# Patient Record
Sex: Male | Born: 1935 | Race: White | Hispanic: No | Marital: Single | Smoking: Former smoker
Health system: Southern US, Community
[De-identification: ages and names within clinical notes are randomized; demographics above are authoritative.]

## PROBLEM LIST (undated history)

## (undated) HISTORY — PX: PACEMAKER PLACEMENT: SHX43

---

## 2005-07-09 ENCOUNTER — Ambulatory Visit: Payer: Self-pay

## 2009-05-11 ENCOUNTER — Ambulatory Visit: Payer: Self-pay | Admitting: Ophthalmology

## 2009-05-24 ENCOUNTER — Ambulatory Visit: Payer: Self-pay | Admitting: Ophthalmology

## 2010-06-30 ENCOUNTER — Emergency Department: Payer: Self-pay | Admitting: Emergency Medicine

## 2011-05-21 ENCOUNTER — Ambulatory Visit: Payer: Self-pay | Admitting: Ophthalmology

## 2011-06-07 ENCOUNTER — Ambulatory Visit: Payer: Self-pay

## 2011-06-08 LAB — PSA: PSA: 4 ng/mL (ref 0.0–4.0)

## 2014-09-09 DIAGNOSIS — I25810 Atherosclerosis of coronary artery bypass graft(s) without angina pectoris: Secondary | ICD-10-CM | POA: Diagnosis not present

## 2014-09-09 DIAGNOSIS — Z0000 Encounter for general adult medical examination without abnormal findings: Secondary | ICD-10-CM | POA: Diagnosis not present

## 2014-09-09 DIAGNOSIS — G588 Other specified mononeuropathies: Secondary | ICD-10-CM | POA: Diagnosis not present

## 2014-09-10 DIAGNOSIS — I25810 Atherosclerosis of coronary artery bypass graft(s) without angina pectoris: Secondary | ICD-10-CM | POA: Diagnosis not present

## 2014-10-27 DIAGNOSIS — F1190 Opioid use, unspecified, uncomplicated: Secondary | ICD-10-CM | POA: Diagnosis not present

## 2014-10-27 DIAGNOSIS — Z5181 Encounter for therapeutic drug level monitoring: Secondary | ICD-10-CM | POA: Diagnosis not present

## 2014-10-27 DIAGNOSIS — G588 Other specified mononeuropathies: Secondary | ICD-10-CM | POA: Diagnosis not present

## 2014-10-27 DIAGNOSIS — Z79891 Long term (current) use of opiate analgesic: Secondary | ICD-10-CM | POA: Diagnosis not present

## 2015-01-18 DIAGNOSIS — E785 Hyperlipidemia, unspecified: Secondary | ICD-10-CM | POA: Diagnosis not present

## 2015-01-18 DIAGNOSIS — Z79899 Other long term (current) drug therapy: Secondary | ICD-10-CM | POA: Diagnosis not present

## 2015-01-20 DIAGNOSIS — Z79899 Other long term (current) drug therapy: Secondary | ICD-10-CM | POA: Diagnosis not present

## 2015-01-20 DIAGNOSIS — M5416 Radiculopathy, lumbar region: Secondary | ICD-10-CM | POA: Diagnosis not present

## 2015-01-20 DIAGNOSIS — M79606 Pain in leg, unspecified: Secondary | ICD-10-CM | POA: Diagnosis not present

## 2015-01-20 DIAGNOSIS — F1120 Opioid dependence, uncomplicated: Secondary | ICD-10-CM | POA: Diagnosis not present

## 2015-01-20 DIAGNOSIS — M5410 Radiculopathy, site unspecified: Secondary | ICD-10-CM | POA: Diagnosis not present

## 2015-01-20 DIAGNOSIS — Z79891 Long term (current) use of opiate analgesic: Secondary | ICD-10-CM | POA: Diagnosis not present

## 2015-01-20 DIAGNOSIS — G8929 Other chronic pain: Secondary | ICD-10-CM | POA: Diagnosis not present

## 2015-01-20 DIAGNOSIS — Z5181 Encounter for therapeutic drug level monitoring: Secondary | ICD-10-CM | POA: Diagnosis not present

## 2015-01-20 DIAGNOSIS — F1190 Opioid use, unspecified, uncomplicated: Secondary | ICD-10-CM | POA: Diagnosis not present

## 2015-01-26 DIAGNOSIS — F1190 Opioid use, unspecified, uncomplicated: Secondary | ICD-10-CM | POA: Diagnosis not present

## 2015-01-26 DIAGNOSIS — Z5181 Encounter for therapeutic drug level monitoring: Secondary | ICD-10-CM | POA: Diagnosis not present

## 2015-01-26 DIAGNOSIS — Z79891 Long term (current) use of opiate analgesic: Secondary | ICD-10-CM | POA: Diagnosis not present

## 2015-01-26 DIAGNOSIS — G603 Idiopathic progressive neuropathy: Secondary | ICD-10-CM | POA: Diagnosis not present

## 2015-02-04 ENCOUNTER — Telehealth: Payer: Self-pay | Admitting: Family Medicine

## 2015-02-09 ENCOUNTER — Telehealth: Payer: Self-pay | Admitting: Anesthesiology

## 2015-02-09 NOTE — Telephone Encounter (Signed)
Error

## 2015-02-09 NOTE — Telephone Encounter (Signed)
Left vm to call to sch apt with parris , pt papers in parris vm folder

## 2015-02-22 DIAGNOSIS — F4542 Pain disorder with related psychological factors: Secondary | ICD-10-CM | POA: Diagnosis not present

## 2015-02-23 DIAGNOSIS — I213 ST elevation (STEMI) myocardial infarction of unspecified site: Secondary | ICD-10-CM | POA: Diagnosis not present

## 2015-02-23 DIAGNOSIS — I509 Heart failure, unspecified: Secondary | ICD-10-CM | POA: Diagnosis not present

## 2015-02-23 DIAGNOSIS — S0990XA Unspecified injury of head, initial encounter: Secondary | ICD-10-CM | POA: Diagnosis not present

## 2015-02-23 DIAGNOSIS — M79606 Pain in leg, unspecified: Secondary | ICD-10-CM | POA: Diagnosis not present

## 2015-02-23 DIAGNOSIS — F329 Major depressive disorder, single episode, unspecified: Secondary | ICD-10-CM | POA: Diagnosis not present

## 2015-03-10 DIAGNOSIS — E782 Mixed hyperlipidemia: Secondary | ICD-10-CM | POA: Diagnosis not present

## 2015-06-07 DIAGNOSIS — F1190 Opioid use, unspecified, uncomplicated: Secondary | ICD-10-CM | POA: Diagnosis not present

## 2015-06-07 DIAGNOSIS — G603 Idiopathic progressive neuropathy: Secondary | ICD-10-CM | POA: Diagnosis not present

## 2015-07-23 ENCOUNTER — Emergency Department
Admission: EM | Admit: 2015-07-23 | Discharge: 2015-07-23 | Disposition: A | Discharge status: Home / Self Care | Payer: No Typology Code available for payment source | Attending: Student | Admitting: Student

## 2015-07-23 ENCOUNTER — Emergency Department: Payer: No Typology Code available for payment source

## 2015-07-23 ENCOUNTER — Encounter: Payer: Self-pay | Admitting: Emergency Medicine

## 2015-07-23 DIAGNOSIS — Y998 Other external cause status: Secondary | ICD-10-CM | POA: Insufficient documentation

## 2015-07-23 DIAGNOSIS — S39012A Strain of muscle, fascia and tendon of lower back, initial encounter: Secondary | ICD-10-CM | POA: Diagnosis not present

## 2015-07-23 DIAGNOSIS — S299XXA Unspecified injury of thorax, initial encounter: Secondary | ICD-10-CM | POA: Diagnosis not present

## 2015-07-23 DIAGNOSIS — S3992XA Unspecified injury of lower back, initial encounter: Secondary | ICD-10-CM | POA: Diagnosis present

## 2015-07-23 DIAGNOSIS — Y92410 Unspecified street and highway as the place of occurrence of the external cause: Secondary | ICD-10-CM | POA: Insufficient documentation

## 2015-07-23 DIAGNOSIS — Z87891 Personal history of nicotine dependence: Secondary | ICD-10-CM | POA: Diagnosis not present

## 2015-07-23 DIAGNOSIS — R079 Chest pain, unspecified: Secondary | ICD-10-CM | POA: Diagnosis not present

## 2015-07-23 DIAGNOSIS — R0789 Other chest pain: Secondary | ICD-10-CM | POA: Diagnosis not present

## 2015-07-23 DIAGNOSIS — Y9389 Activity, other specified: Secondary | ICD-10-CM | POA: Insufficient documentation
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
## 2015-07-23 NOTE — ED Notes (Signed)
[facility] declines seeing patient, patient kept in main waiting room.
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
## 2015-11-29 DIAGNOSIS — Z79891 Long term (current) use of opiate analgesic: Secondary | ICD-10-CM | POA: Diagnosis not present

## 2015-11-29 DIAGNOSIS — G609 Hereditary and idiopathic neuropathy, unspecified: Secondary | ICD-10-CM | POA: Diagnosis not present

## 2015-11-29 DIAGNOSIS — Z5181 Encounter for therapeutic drug level monitoring: Secondary | ICD-10-CM | POA: Diagnosis not present

## 2015-11-29 DIAGNOSIS — F1190 Opioid use, unspecified, uncomplicated: Secondary | ICD-10-CM | POA: Diagnosis not present

## 2016-02-28 DIAGNOSIS — Z5181 Encounter for therapeutic drug level monitoring: Secondary | ICD-10-CM | POA: Diagnosis not present

## 2016-02-28 DIAGNOSIS — Z79891 Long term (current) use of opiate analgesic: Secondary | ICD-10-CM | POA: Diagnosis not present

## 2016-02-28 DIAGNOSIS — G609 Hereditary and idiopathic neuropathy, unspecified: Secondary | ICD-10-CM | POA: Diagnosis not present

## 2016-05-25 DIAGNOSIS — F1190 Opioid use, unspecified, uncomplicated: Secondary | ICD-10-CM | POA: Diagnosis not present

## 2016-05-25 DIAGNOSIS — Z5181 Encounter for therapeutic drug level monitoring: Secondary | ICD-10-CM | POA: Diagnosis not present

## 2016-05-25 DIAGNOSIS — G6189 Other inflammatory polyneuropathies: Secondary | ICD-10-CM | POA: Diagnosis not present

## 2016-05-25 DIAGNOSIS — Z79891 Long term (current) use of opiate analgesic: Secondary | ICD-10-CM | POA: Diagnosis not present
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
## 2016-08-24 DIAGNOSIS — Z79891 Long term (current) use of opiate analgesic: Secondary | ICD-10-CM | POA: Diagnosis not present

## 2016-08-24 DIAGNOSIS — F1190 Opioid use, unspecified, uncomplicated: Secondary | ICD-10-CM | POA: Diagnosis not present

## 2016-08-24 DIAGNOSIS — G609 Hereditary and idiopathic neuropathy, unspecified: Secondary | ICD-10-CM | POA: Diagnosis not present

## 2016-08-24 DIAGNOSIS — Z5181 Encounter for therapeutic drug level monitoring: Secondary | ICD-10-CM | POA: Diagnosis not present

## 2016-11-23 DIAGNOSIS — F1190 Opioid use, unspecified, uncomplicated: Secondary | ICD-10-CM | POA: Diagnosis not present

## 2016-11-23 DIAGNOSIS — G6189 Other inflammatory polyneuropathies: Secondary | ICD-10-CM | POA: Diagnosis not present

## 2017-03-01 DIAGNOSIS — Z79899 Other long term (current) drug therapy: Secondary | ICD-10-CM | POA: Diagnosis not present

## 2017-03-01 DIAGNOSIS — Z87891 Personal history of nicotine dependence: Secondary | ICD-10-CM | POA: Diagnosis not present

## 2017-03-01 DIAGNOSIS — F1190 Opioid use, unspecified, uncomplicated: Secondary | ICD-10-CM | POA: Diagnosis not present

## 2017-03-01 DIAGNOSIS — G6189 Other inflammatory polyneuropathies: Secondary | ICD-10-CM | POA: Diagnosis not present

## 2017-05-21 DIAGNOSIS — F1190 Opioid use, unspecified, uncomplicated: Secondary | ICD-10-CM | POA: Diagnosis not present

## 2017-05-21 DIAGNOSIS — G6189 Other inflammatory polyneuropathies: Secondary | ICD-10-CM | POA: Diagnosis not present

## 2017-05-28 DIAGNOSIS — Z0000 Encounter for general adult medical examination without abnormal findings: Secondary | ICD-10-CM | POA: Diagnosis not present

## 2017-08-20 DIAGNOSIS — G6189 Other inflammatory polyneuropathies: Secondary | ICD-10-CM | POA: Diagnosis not present

## 2017-08-20 DIAGNOSIS — Z5181 Encounter for therapeutic drug level monitoring: Secondary | ICD-10-CM | POA: Diagnosis not present

## 2017-08-20 DIAGNOSIS — Z79891 Long term (current) use of opiate analgesic: Secondary | ICD-10-CM | POA: Diagnosis not present

## 2017-11-08 DIAGNOSIS — G629 Polyneuropathy, unspecified: Secondary | ICD-10-CM | POA: Diagnosis not present

## 2017-11-08 DIAGNOSIS — Z79891 Long term (current) use of opiate analgesic: Secondary | ICD-10-CM | POA: Diagnosis not present

## 2017-11-08 DIAGNOSIS — G709 Myoneural disorder, unspecified: Secondary | ICD-10-CM | POA: Diagnosis not present

## 2017-11-08 DIAGNOSIS — Z5181 Encounter for therapeutic drug level monitoring: Secondary | ICD-10-CM | POA: Diagnosis not present

## 2017-11-15 DIAGNOSIS — I492 Junctional premature depolarization: Secondary | ICD-10-CM | POA: Diagnosis not present

## 2017-11-15 DIAGNOSIS — Z45018 Encounter for adjustment and management of other part of cardiac pacemaker: Secondary | ICD-10-CM | POA: Diagnosis not present

## 2017-11-15 DIAGNOSIS — I495 Sick sinus syndrome: Secondary | ICD-10-CM | POA: Diagnosis not present

## 2017-11-15 DIAGNOSIS — I5032 Chronic diastolic (congestive) heart failure: Secondary | ICD-10-CM | POA: Diagnosis not present

## 2018-02-07 DIAGNOSIS — G629 Polyneuropathy, unspecified: Secondary | ICD-10-CM | POA: Diagnosis not present

## 2018-02-07 DIAGNOSIS — Z79891 Long term (current) use of opiate analgesic: Secondary | ICD-10-CM | POA: Diagnosis not present

## 2018-02-07 DIAGNOSIS — Z5181 Encounter for therapeutic drug level monitoring: Secondary | ICD-10-CM | POA: Diagnosis not present

## 2018-05-08 DIAGNOSIS — Z5181 Encounter for therapeutic drug level monitoring: Secondary | ICD-10-CM | POA: Diagnosis not present

## 2018-05-08 DIAGNOSIS — G629 Polyneuropathy, unspecified: Secondary | ICD-10-CM | POA: Diagnosis not present

## 2018-05-08 DIAGNOSIS — Z79891 Long term (current) use of opiate analgesic: Secondary | ICD-10-CM | POA: Diagnosis not present

## 2018-08-05 DIAGNOSIS — G629 Polyneuropathy, unspecified: Secondary | ICD-10-CM | POA: Diagnosis not present

## 2018-08-05 DIAGNOSIS — Z79891 Long term (current) use of opiate analgesic: Secondary | ICD-10-CM | POA: Diagnosis not present

## 2018-08-05 DIAGNOSIS — F1190 Opioid use, unspecified, uncomplicated: Secondary | ICD-10-CM | POA: Diagnosis not present

## 2018-08-05 DIAGNOSIS — Z5181 Encounter for therapeutic drug level monitoring: Secondary | ICD-10-CM | POA: Diagnosis not present

## 2018-10-22 DIAGNOSIS — C44319 Basal cell carcinoma of skin of other parts of face: Secondary | ICD-10-CM | POA: Diagnosis not present

## 2018-10-22 DIAGNOSIS — L578 Other skin changes due to chronic exposure to nonionizing radiation: Secondary | ICD-10-CM | POA: Diagnosis not present

## 2018-10-22 DIAGNOSIS — D485 Neoplasm of uncertain behavior of skin: Secondary | ICD-10-CM | POA: Diagnosis not present

## 2018-10-28 DIAGNOSIS — C44319 Basal cell carcinoma of skin of other parts of face: Secondary | ICD-10-CM | POA: Diagnosis not present

## 2018-10-28 DIAGNOSIS — L988 Other specified disorders of the skin and subcutaneous tissue: Secondary | ICD-10-CM | POA: Diagnosis not present

## 2018-10-28 DIAGNOSIS — C4491 Basal cell carcinoma of skin, unspecified: Secondary | ICD-10-CM

## 2018-10-28 HISTORY — DX: Basal cell carcinoma of skin, unspecified: C44.91

## 2018-10-29 DIAGNOSIS — C44319 Basal cell carcinoma of skin of other parts of face: Secondary | ICD-10-CM | POA: Diagnosis not present

## 2018-11-04 DIAGNOSIS — G629 Polyneuropathy, unspecified: Secondary | ICD-10-CM | POA: Diagnosis not present

## 2018-11-04 DIAGNOSIS — Z4802 Encounter for removal of sutures: Secondary | ICD-10-CM | POA: Diagnosis not present

## 2018-12-09 DIAGNOSIS — H26491 Other secondary cataract, right eye: Secondary | ICD-10-CM | POA: Diagnosis not present

## 2018-12-09 DIAGNOSIS — H35341 Macular cyst, hole, or pseudohole, right eye: Secondary | ICD-10-CM | POA: Diagnosis not present

## 2018-12-09 DIAGNOSIS — H35372 Puckering of macula, left eye: Secondary | ICD-10-CM | POA: Diagnosis not present

## 2018-12-09 DIAGNOSIS — H02886 Meibomian gland dysfunction of left eye, unspecified eyelid: Secondary | ICD-10-CM | POA: Diagnosis not present

## 2018-12-09 DIAGNOSIS — H02883 Meibomian gland dysfunction of right eye, unspecified eyelid: Secondary | ICD-10-CM | POA: Diagnosis not present

## 2019-02-05 DIAGNOSIS — M79605 Pain in left leg: Secondary | ICD-10-CM | POA: Diagnosis not present

## 2019-02-05 DIAGNOSIS — Z5181 Encounter for therapeutic drug level monitoring: Secondary | ICD-10-CM | POA: Diagnosis not present

## 2019-02-05 DIAGNOSIS — M792 Neuralgia and neuritis, unspecified: Secondary | ICD-10-CM | POA: Diagnosis not present

## 2019-02-05 DIAGNOSIS — Z79891 Long term (current) use of opiate analgesic: Secondary | ICD-10-CM | POA: Diagnosis not present

## 2019-02-05 DIAGNOSIS — F1190 Opioid use, unspecified, uncomplicated: Secondary | ICD-10-CM | POA: Diagnosis not present

## 2019-02-05 DIAGNOSIS — G629 Polyneuropathy, unspecified: Secondary | ICD-10-CM | POA: Diagnosis not present

## 2019-02-05 DIAGNOSIS — G8929 Other chronic pain: Secondary | ICD-10-CM | POA: Diagnosis not present

## 2019-04-07 DIAGNOSIS — Z5181 Encounter for therapeutic drug level monitoring: Secondary | ICD-10-CM | POA: Diagnosis not present

## 2019-04-07 DIAGNOSIS — Z79891 Long term (current) use of opiate analgesic: Secondary | ICD-10-CM | POA: Diagnosis not present

## 2019-04-07 DIAGNOSIS — G629 Polyneuropathy, unspecified: Secondary | ICD-10-CM | POA: Diagnosis not present

## 2019-06-25 DIAGNOSIS — M9905 Segmental and somatic dysfunction of pelvic region: Secondary | ICD-10-CM | POA: Diagnosis not present

## 2019-06-25 DIAGNOSIS — M9903 Segmental and somatic dysfunction of lumbar region: Secondary | ICD-10-CM | POA: Diagnosis not present

## 2019-06-25 DIAGNOSIS — M5416 Radiculopathy, lumbar region: Secondary | ICD-10-CM | POA: Diagnosis not present

## 2019-06-25 DIAGNOSIS — M5136 Other intervertebral disc degeneration, lumbar region: Secondary | ICD-10-CM | POA: Diagnosis not present

## 2019-06-26 DIAGNOSIS — M9905 Segmental and somatic dysfunction of pelvic region: Secondary | ICD-10-CM | POA: Diagnosis not present

## 2019-06-26 DIAGNOSIS — M5136 Other intervertebral disc degeneration, lumbar region: Secondary | ICD-10-CM | POA: Diagnosis not present

## 2019-06-26 DIAGNOSIS — M9903 Segmental and somatic dysfunction of lumbar region: Secondary | ICD-10-CM | POA: Diagnosis not present

## 2019-06-26 DIAGNOSIS — M5416 Radiculopathy, lumbar region: Secondary | ICD-10-CM | POA: Diagnosis not present

## 2019-06-29 DIAGNOSIS — M5416 Radiculopathy, lumbar region: Secondary | ICD-10-CM | POA: Diagnosis not present

## 2019-06-29 DIAGNOSIS — M5136 Other intervertebral disc degeneration, lumbar region: Secondary | ICD-10-CM | POA: Diagnosis not present

## 2019-06-29 DIAGNOSIS — M9905 Segmental and somatic dysfunction of pelvic region: Secondary | ICD-10-CM | POA: Diagnosis not present

## 2019-06-29 DIAGNOSIS — M9903 Segmental and somatic dysfunction of lumbar region: Secondary | ICD-10-CM | POA: Diagnosis not present

## 2019-06-30 DIAGNOSIS — M9905 Segmental and somatic dysfunction of pelvic region: Secondary | ICD-10-CM | POA: Diagnosis not present

## 2019-06-30 DIAGNOSIS — M5136 Other intervertebral disc degeneration, lumbar region: Secondary | ICD-10-CM | POA: Diagnosis not present

## 2019-06-30 DIAGNOSIS — M9903 Segmental and somatic dysfunction of lumbar region: Secondary | ICD-10-CM | POA: Diagnosis not present

## 2019-06-30 DIAGNOSIS — M5416 Radiculopathy, lumbar region: Secondary | ICD-10-CM | POA: Diagnosis not present

## 2019-07-01 DIAGNOSIS — M9903 Segmental and somatic dysfunction of lumbar region: Secondary | ICD-10-CM | POA: Diagnosis not present

## 2019-07-01 DIAGNOSIS — M5136 Other intervertebral disc degeneration, lumbar region: Secondary | ICD-10-CM | POA: Diagnosis not present

## 2019-07-01 DIAGNOSIS — M9905 Segmental and somatic dysfunction of pelvic region: Secondary | ICD-10-CM | POA: Diagnosis not present

## 2019-07-01 DIAGNOSIS — M5416 Radiculopathy, lumbar region: Secondary | ICD-10-CM | POA: Diagnosis not present

## 2019-07-06 DIAGNOSIS — M5416 Radiculopathy, lumbar region: Secondary | ICD-10-CM | POA: Diagnosis not present

## 2019-07-06 DIAGNOSIS — M5136 Other intervertebral disc degeneration, lumbar region: Secondary | ICD-10-CM | POA: Diagnosis not present

## 2019-07-06 DIAGNOSIS — M9903 Segmental and somatic dysfunction of lumbar region: Secondary | ICD-10-CM | POA: Diagnosis not present

## 2019-07-06 DIAGNOSIS — M9905 Segmental and somatic dysfunction of pelvic region: Secondary | ICD-10-CM | POA: Diagnosis not present

## 2019-07-08 DIAGNOSIS — M9903 Segmental and somatic dysfunction of lumbar region: Secondary | ICD-10-CM | POA: Diagnosis not present

## 2019-07-08 DIAGNOSIS — M5136 Other intervertebral disc degeneration, lumbar region: Secondary | ICD-10-CM | POA: Diagnosis not present

## 2019-07-08 DIAGNOSIS — M5416 Radiculopathy, lumbar region: Secondary | ICD-10-CM | POA: Diagnosis not present

## 2019-07-08 DIAGNOSIS — M9905 Segmental and somatic dysfunction of pelvic region: Secondary | ICD-10-CM | POA: Diagnosis not present

## 2019-07-09 DIAGNOSIS — M9905 Segmental and somatic dysfunction of pelvic region: Secondary | ICD-10-CM | POA: Diagnosis not present

## 2019-07-09 DIAGNOSIS — M5136 Other intervertebral disc degeneration, lumbar region: Secondary | ICD-10-CM | POA: Diagnosis not present

## 2019-07-09 DIAGNOSIS — M9903 Segmental and somatic dysfunction of lumbar region: Secondary | ICD-10-CM | POA: Diagnosis not present

## 2019-07-09 DIAGNOSIS — M5416 Radiculopathy, lumbar region: Secondary | ICD-10-CM | POA: Diagnosis not present

## 2019-07-13 DIAGNOSIS — M9905 Segmental and somatic dysfunction of pelvic region: Secondary | ICD-10-CM | POA: Diagnosis not present

## 2019-07-13 DIAGNOSIS — M9903 Segmental and somatic dysfunction of lumbar region: Secondary | ICD-10-CM | POA: Diagnosis not present

## 2019-07-13 DIAGNOSIS — M5416 Radiculopathy, lumbar region: Secondary | ICD-10-CM | POA: Diagnosis not present

## 2019-07-13 DIAGNOSIS — M5136 Other intervertebral disc degeneration, lumbar region: Secondary | ICD-10-CM | POA: Diagnosis not present

## 2019-07-15 DIAGNOSIS — M9905 Segmental and somatic dysfunction of pelvic region: Secondary | ICD-10-CM | POA: Diagnosis not present

## 2019-07-15 DIAGNOSIS — M5416 Radiculopathy, lumbar region: Secondary | ICD-10-CM | POA: Diagnosis not present

## 2019-07-15 DIAGNOSIS — M5136 Other intervertebral disc degeneration, lumbar region: Secondary | ICD-10-CM | POA: Diagnosis not present

## 2019-07-15 DIAGNOSIS — M9903 Segmental and somatic dysfunction of lumbar region: Secondary | ICD-10-CM | POA: Diagnosis not present

## 2019-07-16 DIAGNOSIS — M9905 Segmental and somatic dysfunction of pelvic region: Secondary | ICD-10-CM | POA: Diagnosis not present

## 2019-07-16 DIAGNOSIS — M5136 Other intervertebral disc degeneration, lumbar region: Secondary | ICD-10-CM | POA: Diagnosis not present

## 2019-07-16 DIAGNOSIS — M5416 Radiculopathy, lumbar region: Secondary | ICD-10-CM | POA: Diagnosis not present

## 2019-07-16 DIAGNOSIS — M9903 Segmental and somatic dysfunction of lumbar region: Secondary | ICD-10-CM | POA: Diagnosis not present

## 2019-07-21 DIAGNOSIS — M9903 Segmental and somatic dysfunction of lumbar region: Secondary | ICD-10-CM | POA: Diagnosis not present

## 2019-07-21 DIAGNOSIS — M9905 Segmental and somatic dysfunction of pelvic region: Secondary | ICD-10-CM | POA: Diagnosis not present

## 2019-07-21 DIAGNOSIS — M5136 Other intervertebral disc degeneration, lumbar region: Secondary | ICD-10-CM | POA: Diagnosis not present

## 2019-07-21 DIAGNOSIS — M5416 Radiculopathy, lumbar region: Secondary | ICD-10-CM | POA: Diagnosis not present

## 2019-07-23 DIAGNOSIS — M5416 Radiculopathy, lumbar region: Secondary | ICD-10-CM | POA: Diagnosis not present

## 2019-07-23 DIAGNOSIS — M5136 Other intervertebral disc degeneration, lumbar region: Secondary | ICD-10-CM | POA: Diagnosis not present

## 2019-07-23 DIAGNOSIS — M9903 Segmental and somatic dysfunction of lumbar region: Secondary | ICD-10-CM | POA: Diagnosis not present

## 2019-07-23 DIAGNOSIS — M9905 Segmental and somatic dysfunction of pelvic region: Secondary | ICD-10-CM | POA: Diagnosis not present

## 2019-07-24 DIAGNOSIS — G709 Myoneural disorder, unspecified: Secondary | ICD-10-CM | POA: Diagnosis not present

## 2019-07-24 DIAGNOSIS — Z5181 Encounter for therapeutic drug level monitoring: Secondary | ICD-10-CM | POA: Diagnosis not present

## 2019-07-24 DIAGNOSIS — Z79891 Long term (current) use of opiate analgesic: Secondary | ICD-10-CM | POA: Diagnosis not present

## 2019-07-24 DIAGNOSIS — G8929 Other chronic pain: Secondary | ICD-10-CM | POA: Diagnosis not present

## 2019-07-24 DIAGNOSIS — G629 Polyneuropathy, unspecified: Secondary | ICD-10-CM | POA: Diagnosis not present

## 2019-07-24 DIAGNOSIS — M792 Neuralgia and neuritis, unspecified: Secondary | ICD-10-CM | POA: Diagnosis not present

## 2019-07-28 DIAGNOSIS — M9903 Segmental and somatic dysfunction of lumbar region: Secondary | ICD-10-CM | POA: Diagnosis not present

## 2019-07-28 DIAGNOSIS — M9905 Segmental and somatic dysfunction of pelvic region: Secondary | ICD-10-CM | POA: Diagnosis not present

## 2019-07-28 DIAGNOSIS — M5136 Other intervertebral disc degeneration, lumbar region: Secondary | ICD-10-CM | POA: Diagnosis not present

## 2019-07-28 DIAGNOSIS — M5416 Radiculopathy, lumbar region: Secondary | ICD-10-CM | POA: Diagnosis not present

## 2019-07-30 DIAGNOSIS — M9905 Segmental and somatic dysfunction of pelvic region: Secondary | ICD-10-CM | POA: Diagnosis not present

## 2019-07-30 DIAGNOSIS — M5136 Other intervertebral disc degeneration, lumbar region: Secondary | ICD-10-CM | POA: Diagnosis not present

## 2019-07-30 DIAGNOSIS — M9903 Segmental and somatic dysfunction of lumbar region: Secondary | ICD-10-CM | POA: Diagnosis not present

## 2019-07-30 DIAGNOSIS — M5416 Radiculopathy, lumbar region: Secondary | ICD-10-CM | POA: Diagnosis not present

## 2019-08-05 DIAGNOSIS — M5136 Other intervertebral disc degeneration, lumbar region: Secondary | ICD-10-CM | POA: Diagnosis not present

## 2019-08-05 DIAGNOSIS — M9903 Segmental and somatic dysfunction of lumbar region: Secondary | ICD-10-CM | POA: Diagnosis not present

## 2019-08-05 DIAGNOSIS — M9905 Segmental and somatic dysfunction of pelvic region: Secondary | ICD-10-CM | POA: Diagnosis not present

## 2019-08-05 DIAGNOSIS — M5416 Radiculopathy, lumbar region: Secondary | ICD-10-CM | POA: Diagnosis not present

## 2019-08-12 DIAGNOSIS — M5136 Other intervertebral disc degeneration, lumbar region: Secondary | ICD-10-CM | POA: Diagnosis not present

## 2019-08-12 DIAGNOSIS — M9903 Segmental and somatic dysfunction of lumbar region: Secondary | ICD-10-CM | POA: Diagnosis not present

## 2019-08-12 DIAGNOSIS — M9905 Segmental and somatic dysfunction of pelvic region: Secondary | ICD-10-CM | POA: Diagnosis not present

## 2019-08-12 DIAGNOSIS — M5416 Radiculopathy, lumbar region: Secondary | ICD-10-CM | POA: Diagnosis not present

## 2019-08-19 DIAGNOSIS — M9903 Segmental and somatic dysfunction of lumbar region: Secondary | ICD-10-CM | POA: Diagnosis not present

## 2019-08-19 DIAGNOSIS — M5416 Radiculopathy, lumbar region: Secondary | ICD-10-CM | POA: Diagnosis not present

## 2019-08-19 DIAGNOSIS — M9905 Segmental and somatic dysfunction of pelvic region: Secondary | ICD-10-CM | POA: Diagnosis not present

## 2019-08-19 DIAGNOSIS — M5136 Other intervertebral disc degeneration, lumbar region: Secondary | ICD-10-CM | POA: Diagnosis not present

## 2019-10-23 DIAGNOSIS — S59901A Unspecified injury of right elbow, initial encounter: Secondary | ICD-10-CM | POA: Diagnosis not present

## 2019-10-23 DIAGNOSIS — G238 Other specified degenerative diseases of basal ganglia: Secondary | ICD-10-CM | POA: Diagnosis not present

## 2019-10-23 DIAGNOSIS — S3993XA Unspecified injury of pelvis, initial encounter: Secondary | ICD-10-CM | POA: Diagnosis not present

## 2019-10-23 DIAGNOSIS — R296 Repeated falls: Secondary | ICD-10-CM | POA: Diagnosis not present

## 2019-10-23 DIAGNOSIS — S0081XA Abrasion of other part of head, initial encounter: Secondary | ICD-10-CM | POA: Diagnosis not present

## 2019-10-23 DIAGNOSIS — R9431 Abnormal electrocardiogram [ECG] [EKG]: Secondary | ICD-10-CM | POA: Diagnosis not present

## 2019-10-23 DIAGNOSIS — E782 Mixed hyperlipidemia: Secondary | ICD-10-CM | POA: Diagnosis not present

## 2019-10-23 DIAGNOSIS — G629 Polyneuropathy, unspecified: Secondary | ICD-10-CM | POA: Diagnosis not present

## 2019-10-23 DIAGNOSIS — G8929 Other chronic pain: Secondary | ICD-10-CM | POA: Diagnosis not present

## 2019-10-23 DIAGNOSIS — I440 Atrioventricular block, first degree: Secondary | ICD-10-CM | POA: Diagnosis not present

## 2019-10-23 DIAGNOSIS — T82857A Stenosis of cardiac prosthetic devices, implants and grafts, initial encounter: Secondary | ICD-10-CM | POA: Diagnosis not present

## 2019-10-23 DIAGNOSIS — I2510 Atherosclerotic heart disease of native coronary artery without angina pectoris: Secondary | ICD-10-CM | POA: Diagnosis not present

## 2019-10-23 DIAGNOSIS — W19XXXA Unspecified fall, initial encounter: Secondary | ICD-10-CM | POA: Diagnosis not present

## 2019-10-23 DIAGNOSIS — I25810 Atherosclerosis of coronary artery bypass graft(s) without angina pectoris: Secondary | ICD-10-CM | POA: Diagnosis not present

## 2019-10-23 DIAGNOSIS — S0990XA Unspecified injury of head, initial encounter: Secondary | ICD-10-CM | POA: Diagnosis not present

## 2019-10-23 DIAGNOSIS — I214 Non-ST elevation (NSTEMI) myocardial infarction: Secondary | ICD-10-CM | POA: Diagnosis not present

## 2019-10-23 DIAGNOSIS — F1290 Cannabis use, unspecified, uncomplicated: Secondary | ICD-10-CM | POA: Diagnosis not present

## 2019-10-23 DIAGNOSIS — F1190 Opioid use, unspecified, uncomplicated: Secondary | ICD-10-CM | POA: Diagnosis not present

## 2019-10-23 DIAGNOSIS — I5021 Acute systolic (congestive) heart failure: Secondary | ICD-10-CM | POA: Diagnosis not present

## 2019-10-23 DIAGNOSIS — Z951 Presence of aortocoronary bypass graft: Secondary | ICD-10-CM | POA: Diagnosis not present

## 2019-10-23 DIAGNOSIS — R410 Disorientation, unspecified: Secondary | ICD-10-CM | POA: Diagnosis not present

## 2019-10-23 DIAGNOSIS — R778 Other specified abnormalities of plasma proteins: Secondary | ICD-10-CM | POA: Diagnosis not present

## 2019-10-23 DIAGNOSIS — N179 Acute kidney failure, unspecified: Secondary | ICD-10-CM | POA: Diagnosis not present

## 2019-10-23 DIAGNOSIS — Z79891 Long term (current) use of opiate analgesic: Secondary | ICD-10-CM | POA: Diagnosis not present

## 2019-10-23 DIAGNOSIS — M792 Neuralgia and neuritis, unspecified: Secondary | ICD-10-CM | POA: Diagnosis not present

## 2019-10-23 DIAGNOSIS — S6992XA Unspecified injury of left wrist, hand and finger(s), initial encounter: Secondary | ICD-10-CM | POA: Diagnosis not present

## 2019-10-23 DIAGNOSIS — S199XXA Unspecified injury of neck, initial encounter: Secondary | ICD-10-CM | POA: Diagnosis not present

## 2019-10-23 DIAGNOSIS — Y929 Unspecified place or not applicable: Secondary | ICD-10-CM | POA: Diagnosis not present

## 2019-10-23 DIAGNOSIS — I495 Sick sinus syndrome: Secondary | ICD-10-CM | POA: Diagnosis not present

## 2019-10-23 DIAGNOSIS — S299XXA Unspecified injury of thorax, initial encounter: Secondary | ICD-10-CM | POA: Diagnosis not present

## 2019-10-23 DIAGNOSIS — R4182 Altered mental status, unspecified: Secondary | ICD-10-CM | POA: Diagnosis not present

## 2019-10-23 DIAGNOSIS — Z5181 Encounter for therapeutic drug level monitoring: Secondary | ICD-10-CM | POA: Diagnosis not present

## 2019-10-23 DIAGNOSIS — Z950 Presence of cardiac pacemaker: Secondary | ICD-10-CM | POA: Diagnosis not present

## 2019-10-25 DIAGNOSIS — S3993XA Unspecified injury of pelvis, initial encounter: Secondary | ICD-10-CM | POA: Diagnosis not present

## 2019-10-25 DIAGNOSIS — W19XXXA Unspecified fall, initial encounter: Secondary | ICD-10-CM | POA: Diagnosis not present

## 2019-10-27 DIAGNOSIS — I25810 Atherosclerosis of coronary artery bypass graft(s) without angina pectoris: Secondary | ICD-10-CM | POA: Diagnosis not present

## 2019-10-27 DIAGNOSIS — I2510 Atherosclerotic heart disease of native coronary artery without angina pectoris: Secondary | ICD-10-CM | POA: Diagnosis not present

## 2019-10-27 DIAGNOSIS — Z951 Presence of aortocoronary bypass graft: Secondary | ICD-10-CM | POA: Diagnosis not present

## 2019-10-27 DIAGNOSIS — I214 Non-ST elevation (NSTEMI) myocardial infarction: Secondary | ICD-10-CM | POA: Diagnosis not present

## 2019-10-27 DIAGNOSIS — T82857A Stenosis of cardiac prosthetic devices, implants and grafts, initial encounter: Secondary | ICD-10-CM | POA: Diagnosis not present

## 2019-10-27 DIAGNOSIS — Z950 Presence of cardiac pacemaker: Secondary | ICD-10-CM | POA: Diagnosis not present

## 2019-11-05 DIAGNOSIS — I25810 Atherosclerosis of coronary artery bypass graft(s) without angina pectoris: Secondary | ICD-10-CM | POA: Diagnosis not present

## 2019-11-05 DIAGNOSIS — E782 Mixed hyperlipidemia: Secondary | ICD-10-CM | POA: Diagnosis not present

## 2019-11-05 DIAGNOSIS — I5021 Acute systolic (congestive) heart failure: Secondary | ICD-10-CM | POA: Diagnosis not present

## 2020-01-18 DIAGNOSIS — G629 Polyneuropathy, unspecified: Secondary | ICD-10-CM | POA: Diagnosis not present

## 2020-01-18 DIAGNOSIS — Z5181 Encounter for therapeutic drug level monitoring: Secondary | ICD-10-CM | POA: Diagnosis not present

## 2020-01-18 DIAGNOSIS — Z79891 Long term (current) use of opiate analgesic: Secondary | ICD-10-CM | POA: Diagnosis not present

## 2020-03-21 DIAGNOSIS — G629 Polyneuropathy, unspecified: Secondary | ICD-10-CM | POA: Diagnosis not present

## 2020-03-21 DIAGNOSIS — Z5181 Encounter for therapeutic drug level monitoring: Secondary | ICD-10-CM | POA: Diagnosis not present

## 2020-03-21 DIAGNOSIS — M792 Neuralgia and neuritis, unspecified: Secondary | ICD-10-CM | POA: Diagnosis not present

## 2020-03-21 DIAGNOSIS — Z79891 Long term (current) use of opiate analgesic: Secondary | ICD-10-CM | POA: Diagnosis not present

## 2020-03-21 DIAGNOSIS — G8929 Other chronic pain: Secondary | ICD-10-CM | POA: Diagnosis not present

## 2020-04-07 DIAGNOSIS — I2510 Atherosclerotic heart disease of native coronary artery without angina pectoris: Secondary | ICD-10-CM | POA: Diagnosis not present

## 2020-04-07 DIAGNOSIS — I509 Heart failure, unspecified: Secondary | ICD-10-CM | POA: Diagnosis not present

## 2020-04-07 DIAGNOSIS — I5021 Acute systolic (congestive) heart failure: Secondary | ICD-10-CM | POA: Diagnosis not present

## 2020-04-07 DIAGNOSIS — R5383 Other fatigue: Secondary | ICD-10-CM | POA: Diagnosis not present

## 2020-04-07 DIAGNOSIS — E785 Hyperlipidemia, unspecified: Secondary | ICD-10-CM | POA: Diagnosis not present

## 2020-04-07 DIAGNOSIS — Z131 Encounter for screening for diabetes mellitus: Secondary | ICD-10-CM | POA: Diagnosis not present

## 2020-04-07 DIAGNOSIS — R630 Anorexia: Secondary | ICD-10-CM | POA: Diagnosis not present

## 2020-04-07 DIAGNOSIS — I252 Old myocardial infarction: Secondary | ICD-10-CM | POA: Diagnosis not present

## 2020-04-07 DIAGNOSIS — R634 Abnormal weight loss: Secondary | ICD-10-CM | POA: Diagnosis not present

## 2020-04-07 DIAGNOSIS — K219 Gastro-esophageal reflux disease without esophagitis: Secondary | ICD-10-CM | POA: Diagnosis not present

## 2020-04-07 DIAGNOSIS — R1310 Dysphagia, unspecified: Secondary | ICD-10-CM | POA: Diagnosis not present

## 2020-04-07 DIAGNOSIS — Z681 Body mass index (BMI) 19 or less, adult: Secondary | ICD-10-CM | POA: Diagnosis not present

## 2020-04-29 DIAGNOSIS — K219 Gastro-esophageal reflux disease without esophagitis: Secondary | ICD-10-CM | POA: Diagnosis not present

## 2020-04-29 DIAGNOSIS — N179 Acute kidney failure, unspecified: Secondary | ICD-10-CM | POA: Diagnosis not present

## 2020-04-29 DIAGNOSIS — G4701 Insomnia due to medical condition: Secondary | ICD-10-CM | POA: Diagnosis not present

## 2020-04-29 DIAGNOSIS — D649 Anemia, unspecified: Secondary | ICD-10-CM | POA: Diagnosis not present

## 2020-05-20 DIAGNOSIS — G4701 Insomnia due to medical condition: Secondary | ICD-10-CM | POA: Diagnosis not present

## 2020-06-22 DIAGNOSIS — M792 Neuralgia and neuritis, unspecified: Secondary | ICD-10-CM | POA: Diagnosis not present

## 2020-06-22 DIAGNOSIS — Z5181 Encounter for therapeutic drug level monitoring: Secondary | ICD-10-CM | POA: Diagnosis not present

## 2020-06-22 DIAGNOSIS — Z79891 Long term (current) use of opiate analgesic: Secondary | ICD-10-CM | POA: Diagnosis not present

## 2020-06-22 DIAGNOSIS — G629 Polyneuropathy, unspecified: Secondary | ICD-10-CM | POA: Diagnosis not present

## 2020-06-22 DIAGNOSIS — G8929 Other chronic pain: Secondary | ICD-10-CM | POA: Diagnosis not present

## 2021-06-26 ENCOUNTER — Ambulatory Visit: Payer: Commercial Managed Care - HMO | Admitting: Dermatology

## 2021-06-26 ENCOUNTER — Ambulatory Visit: Payer: Medicare HMO | Admitting: Dermatology

## 2021-06-26 ENCOUNTER — Other Ambulatory Visit: Payer: Self-pay

## 2021-06-26 ENCOUNTER — Encounter: Payer: Self-pay | Admitting: Dermatology

## 2021-06-26 ENCOUNTER — Other Ambulatory Visit: Payer: Self-pay | Admitting: Dermatology

## 2021-06-26 DIAGNOSIS — C44629 Squamous cell carcinoma of skin of left upper limb, including shoulder: Secondary | ICD-10-CM | POA: Diagnosis not present

## 2021-06-26 DIAGNOSIS — D485 Neoplasm of uncertain behavior of skin: Secondary | ICD-10-CM

## 2021-06-26 DIAGNOSIS — C4492 Squamous cell carcinoma of skin, unspecified: Secondary | ICD-10-CM

## 2021-06-26 DIAGNOSIS — L814 Other melanin hyperpigmentation: Secondary | ICD-10-CM

## 2021-06-26 HISTORY — DX: Squamous cell carcinoma of skin, unspecified: C44.92

## 2021-06-26 NOTE — Progress Notes (Signed)
   New Patient Visit  Subjective  Alexander Mcdaniel is a 86 y.o. male who presents for the following: Growth (Left posterior shoulder x 6-7 months. Hit with a towel and bled, growing. He thought it was a wart, so he tried OTC med.).   The following portions of the chart were reviewed this encounter and updated as appropriate:       Review of Systems:  No other skin or systemic complaints except as noted in HPI or Assessment and Plan.  Objective  Well appearing patient in no apparent distress; mood and affect are within normal limits.  A focused examination was performed including left posterior shoulder. Relevant physical exam findings are noted in the Assessment and Plan.  Left Posterior Shoulder 1.6cm pink keratotic nodule        Assessment & Plan  Neoplasm of uncertain behavior of skin Left Posterior Shoulder  Skin / nail biopsy Type of biopsy: tangential   Informed consent: discussed and consent obtained   Patient was prepped and draped in usual sterile fashion: Area prepped with alcohol. Anesthesia: the lesion was anesthetized in a standard fashion   Anesthetic:  1% lidocaine w/ epinephrine 1-100,000 buffered w/ 8.4% NaHCO3 Instrument used: flexible razor blade   Hemostasis achieved with: pressure, aluminum chloride and electrodesiccation   Outcome: patient tolerated procedure well    Destruction of lesion  Destruction method: electrodesiccation and curettage   Informed consent: discussed and consent obtained   Timeout:  patient name, date of birth, surgical site, and procedure verified Curettage performed in three different directions: Yes   Electrodesiccation performed over the curetted area: Yes   Final wound size (cm):  1.8 Hemostasis achieved with:  pressure, aluminum chloride and electrodesiccation Outcome: patient tolerated procedure well with no complications   Post-procedure details: wound care instructions given   Post-procedure details comment:   Ointment and bandage applied.  Specimen 1 - Surgical pathology Differential Diagnosis: Inflamed SK r/o SCC/BCC Check Margins: No 1.6cm pink keratotic nodule EDC today  Lentigines - Scattered tan macules shoulder - Due to sun exposure - Benign-appering, observe - Recommend daily broad spectrum sunscreen SPF 30+ to sun-exposed areas, reapply every 2 hours as needed. - Call for any changes   Return in about 6 months (around 12/24/2021) for USBE, f/u bx.  IJamesetta Orleans, CMA, am acting as scribe for Brendolyn Patty, MD . Documentation: I have reviewed the above documentation for accuracy and completeness, and I agree with the above.  Brendolyn Patty MD

## 2021-06-26 NOTE — Patient Instructions (Addendum)

## 2021-06-27 ENCOUNTER — Telehealth: Payer: Self-pay

## 2021-06-27 NOTE — Telephone Encounter (Signed)
Advised pt of bx results/sh ?

## 2021-06-27 NOTE — Telephone Encounter (Signed)
-----   Message from Alexander Patty, MD sent at 06/27/2021  4:17 PM EST ----- Skin , left posterior shoulder WELL DIFFERENTIATED SQUAMOUS CELL CARCINOMA, BASE INVOLVED  SCC skin cancer- already treated with EDC at time of biopsy   - please call patient

## 2021-09-06 DEATH — deceased

## 2022-01-02 ENCOUNTER — Ambulatory Visit: Payer: Self-pay | Admitting: Dermatology
